# Patient Record
Sex: Male | Born: 2000 | Race: Black or African American | Hispanic: No | Marital: Single | Smoking: Never smoker
Health system: Southern US, Community
[De-identification: ages and names within clinical notes are randomized; demographics above are authoritative.]

---

## 2019-04-22 ENCOUNTER — Ambulatory Visit: Payer: Self-pay

## 2019-04-22 ENCOUNTER — Other Ambulatory Visit: Payer: Self-pay

## 2019-04-22 ENCOUNTER — Ambulatory Visit (INDEPENDENT_AMBULATORY_CARE_PROVIDER_SITE_OTHER): Payer: BC Managed Care – PPO | Admitting: Orthopedic Surgery

## 2019-04-22 ENCOUNTER — Encounter: Payer: Self-pay | Admitting: Orthopedic Surgery

## 2019-04-22 DIAGNOSIS — M25572 Pain in left ankle and joints of left foot: Secondary | ICD-10-CM | POA: Diagnosis not present

## 2019-04-22 NOTE — Progress Notes (Signed)
Office Visit Note   Patient: Rick Miles           Date of Birth: 10/29/2000           MRN: [id number] Visit Date: 04/22/2019 Requested by: No referring provider defined for this encounter. PCP: Ronnald Nian, MD  Subjective: Chief Complaint  Patient presents with  . Left Ankle - Pain  . 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  6 days ago he came down from a dunk and had some medial sided pain.  This forced him to come out of the practice session.  Reports continued pain since that time localizing to the medial side.  Denies any prior injury to the foot.  When he came down there was no inversion or eversion injury to the foot or ankle.              ROS: All systems reviewed are negative as they relate to the chief complaint within the history of present illness.  Patient denies  fevers or chills.   Assessment & Plan: Visit Diagnoses:  1. Pain in left ankle and joints of left foot     Plan: Impression is medial sided ankle pain in a high-level athlete with normal radiographic findings and mild swelling in the tibiotalar joint.  I think this could represent talar dome lesion versus stress reaction.  He has tried conservative measures such as a walking boot and anti-inflammatories.  Concern at this time would be for occult talar dome lesion versus posterior tib tendinitis versus stress reaction in the talus.  Plan MRI scan left ankle for evaluation for talar dome lesion and talar stress fracture.  Follow-up after that study.  Follow-Up Instructions: No follow-ups on file.   Orders:  Orders Placed This Encounter  Procedures  . XR Foot Complete Left  . XR Ankle Complete Left  . MR Ankle Left w/o contrast   No orders of the defined types were placed in this encounter.     Procedures: No procedures performed   Clinical Data: No additional findings.  Objective: Vital Signs: There were no vitals taken for this  visit.  Physical Exam:   Constitutional: Patient appears well-developed HEENT:  Head: Normocephalic Eyes:EOM are normal Neck: Normal range of motion Cardiovascular: Normal rate Pulmonary/chest: Effort normal Neurologic: Patient is alert Skin: Skin is warm Psychiatric: Patient has normal mood and affect    Ortho Exam: Ortho exam demonstrates antalgic gait to the left.  Patient has palpable intact nontender anterior to posterior to peroneal Achilles tendons.  Has pretty good inversion eversion strength but does localize tenderness around the medial malleolus but deeper.  No definite mechanical symptoms with passive range of motion of the tibiotalar or subtalar joint.  No pain with pronation supination of the forefoot.  No other masses lymphadenopathy or skin changes noted in that left foot region.  Slight swelling of the tibiotalar joint is present.  Specialty Comments:  No specialty comments available.  Imaging: XR Ankle Complete Left  Result Date: 04/22/2019 AP mortise lateral left ankle reviewed.   Tibiotalar joint maintained.  No asymmetry of the mortise..  No acute fracture.  Normal left ankle  XR Foot Complete Left  Result Date: 04/22/2019 AP lateral oblique left foot reviewed.  No acute fracture.  Tarsometatarsal alignment intact.  Talar neck intact without fracture.  Anterior process calcaneus normal.  Normal left foot    PMFS History: There are  no problems to display for this patient.  No past medical history on file.  No family history on file.   Social History   Occupational History  . Not on file  Tobacco Use  . Smoking status: Not on file  Substance and Sexual Activity  . Alcohol use: Not on file  . Drug use: Not on file  . Sexual activity: Not on file

## 2019-07-31 ENCOUNTER — Encounter (HOSPITAL_COMMUNITY): Payer: Self-pay | Admitting: Emergency Medicine

## 2019-07-31 ENCOUNTER — Emergency Department (HOSPITAL_COMMUNITY): Payer: BC Managed Care – PPO

## 2019-07-31 ENCOUNTER — Emergency Department (HOSPITAL_COMMUNITY)
Admission: EM | Admit: 2019-07-31 | Discharge: 2019-07-31 | Disposition: A | Discharge status: Home / Self Care | Payer: BC Managed Care – PPO | Attending: Emergency Medicine | Admitting: Emergency Medicine

## 2019-07-31 ENCOUNTER — Other Ambulatory Visit: Payer: Self-pay

## 2019-07-31 DIAGNOSIS — E876 Hypokalemia: Secondary | ICD-10-CM | POA: Diagnosis not present

## 2019-07-31 DIAGNOSIS — R42 Dizziness and giddiness: Secondary | ICD-10-CM | POA: Diagnosis not present

## 2019-07-31 DIAGNOSIS — R112 Nausea with vomiting, unspecified: Secondary | ICD-10-CM | POA: Insufficient documentation

## 2019-07-31 DIAGNOSIS — R0600 Dyspnea, unspecified: Secondary | ICD-10-CM | POA: Insufficient documentation

## 2019-07-31 DIAGNOSIS — R0989 Other specified symptoms and signs involving the circulatory and respiratory systems: Secondary | ICD-10-CM

## 2019-07-31 DIAGNOSIS — F458 Other somatoform disorders: Secondary | ICD-10-CM

## 2019-07-31 DIAGNOSIS — R17 Unspecified jaundice: Secondary | ICD-10-CM

## 2019-07-31 DIAGNOSIS — R064 Hyperventilation: Secondary | ICD-10-CM | POA: Insufficient documentation

## 2019-07-31 DIAGNOSIS — R0602 Shortness of breath: Secondary | ICD-10-CM | POA: Diagnosis present

## 2019-07-31 LAB — COMPREHENSIVE METABOLIC PANEL
ALT: 27 U/L (ref 0–44)
AST: 46 U/L — ABNORMAL HIGH (ref 15–41)
Albumin: 4.8 g/dL (ref 3.5–5.0)
Alkaline Phosphatase: 66 U/L (ref 38–126)
Anion gap: 14 (ref 5–15)
BUN: 20 mg/dL (ref 6–20)
CO2: 20 mmol/L — ABNORMAL LOW (ref 22–32)
Calcium: 9.5 mg/dL (ref 8.9–10.3)
Chloride: 103 mmol/L (ref 98–111)
Creatinine, Ser: 1.2 mg/dL (ref 0.61–1.24)
GFR calc Af Amer: >60 mL/min (ref >60)
GFR calc non Af Amer: >60 mL/min (ref >60)
Glucose, Bld: 112 mg/dL — ABNORMAL HIGH (ref 70–99)
Potassium: 3 mmol/L — ABNORMAL LOW (ref 3.5–5.1)
Sodium: 137 mmol/L (ref 135–145)
Total Bilirubin: 2.9 mg/dL — ABNORMAL HIGH (ref 0.3–1.2)
Total Protein: 7.7 g/dL (ref 6.5–8.1)

## 2019-07-31 LAB — CBC
HCT: 39.9 % (ref 39.0–52.0)
Hemoglobin: 13.8 g/dL (ref 13.0–17.0)
MCH: 32.1 pg (ref 26.0–34.0)
MCHC: 34.6 g/dL (ref 30.0–36.0)
MCV: 92.8 fL (ref 80.0–100.0)
Platelets: 165 10*3/uL (ref 150–400)
RBC: 4.3 MIL/uL (ref 4.22–5.81)
RDW: 12.8 % (ref 11.5–15.5)
WBC: 9.1 10*3/uL (ref 4.0–10.5)
nRBC: 0 % (ref 0.0–0.2)

## 2019-07-31 LAB — URINALYSIS, ROUTINE W REFLEX MICROSCOPIC
Bilirubin Urine: NEGATIVE
Glucose, UA: NEGATIVE mg/dL
Hgb urine dipstick: NEGATIVE
Ketones, ur: 80 mg/dL — AB
Leukocytes,Ua: NEGATIVE
Nitrite: NEGATIVE
Protein, ur: 30 mg/dL — AB
Specific Gravity, Urine: 1.023 (ref 1.005–1.030)
pH: 9 — ABNORMAL HIGH (ref 5.0–8.0)

## 2019-07-31 LAB — LIPASE, BLOOD: Lipase: 25 U/L (ref 11–51)

## 2019-07-31 MED ORDER — LORAZEPAM 2 MG/ML IJ SOLN
1.0000 mg | Freq: Once | INTRAMUSCULAR | Status: AC
  Administered 2019-07-31: 1 mg via INTRAVENOUS
  Filled 2019-07-31: qty 1

## 2019-07-31 MED ORDER — SODIUM CHLORIDE 0.9 % IV BOLUS
1000.0000 mL | Freq: Once | INTRAVENOUS | Status: AC
  Administered 2019-07-31: 1000 mL via INTRAVENOUS

## 2019-07-31 MED ORDER — ONDANSETRON 4 MG PO TBDP
4.0000 mg | ORAL_TABLET | Freq: Once | ORAL | Status: DC | PRN
  Filled 2019-07-31: qty 1

## 2019-07-31 MED ORDER — ONDANSETRON 4 MG PO TBDP
4.0000 mg | ORAL_TABLET | Freq: Once | ORAL | Status: AC | PRN
  Administered 2019-07-31: 4 mg via ORAL

## 2019-07-31 MED ORDER — POTASSIUM CHLORIDE CRYS ER 20 MEQ PO TBCR
40.0000 meq | EXTENDED_RELEASE_TABLET | Freq: Once | ORAL | Status: AC
  Administered 2019-07-31: 40 meq via ORAL
  Filled 2019-07-31: qty 2

## 2019-07-31 MED ORDER — SODIUM CHLORIDE 0.9% FLUSH: 3.0000 mL | Freq: Once | INTRAVENOUS | Status: DC

## 2019-07-31 MED ORDER — ONDANSETRON HCL 4 MG/2ML IJ SOLN
4.0000 mg | Freq: Once | INTRAMUSCULAR | Status: AC
  Administered 2019-07-31: 4 mg via INTRAVENOUS
  Filled 2019-07-31: qty 2

## 2019-07-31 MED ORDER — ONDANSETRON HCL 4 MG PO TABS
4.0000 mg | ORAL_TABLET | Freq: Four times a day (QID) | ORAL | 0 refills | Status: AC | PRN
Start: 2019-07-31 — End: ?

## 2019-07-31 NOTE — ED Triage Notes (Signed)
Patient is complaining of sob and body aches from his second covid shot. Patient all sprawled out acting like he is dying. Patient O2 sat is 100%.

## 2019-07-31 NOTE — Discharge Instructions (Addendum)
Return if you are having any problems. 

## 2019-07-31 NOTE — ED Provider Notes (Signed)
[facility]-EMERGENCY DEPT Provider Note   CSN: [id number] Arrival date & time: 07/31/19  0349   History Chief Complaint  Patient presents with   Emesis   Shortness of Breath    Rick Miles is a 19 y.o. male.  The history is provided by the patient.  Emesis Shortness of Breath Associated symptoms: vomiting   He has history of tobacco use and comes in because of vomiting and dyspnea.  He received his second dose of the Materna vaccine for COVID-19 yesterday, and was doing well until he woke up tonight with multiple episodes of vomiting.  He also noted he was dyspneic.  His hands were numb and his face was numb and his mouth was dry.  He noticed that his hands were in spasm.  There was associated dizziness almost to the point of near syncope.  He did not have any numbness or spasm in his feet.  There was no fever, and no chills or sweats.  History reviewed. No pertinent past medical history.  There are no problems to display for this patient.   History reviewed. No pertinent surgical history.     History reviewed. No pertinent family history.  Social History   Tobacco Use   Smoking status: Never Smoker   Smokeless tobacco: Never Used  Substance Use Topics   Alcohol use: Not on file   Drug use: Not on file    Home Medications Prior to Admission medications   Not on File    Allergies    Patient has no known allergies.  Review of Systems   Review of Systems  Respiratory: Positive for shortness of breath.   Gastrointestinal: Positive for vomiting.  All other systems reviewed and are negative.   Physical Exam Updated Vital Signs BP (!) 145/61    Pulse (!) 112    Temp 98.1 F (36.7 C)    Resp (!) 21    Ht 6\' 5"  (1.956 m)    Wt 93 kg    SpO2 100%    BMI 24.31 kg/m   Physical Exam Vitals and nursing note reviewed.   19 year old male, resting comfortably and in no acute distress. Vital signs are significant for elevated blood  pressure, respiratory rate, heart rate. Oxygen saturation is 100%, which is normal.  He appears mildly tachypneic at rest. Head is normocephalic and atraumatic. PERRLA, EOMI. Oropharynx is clear. Neck is nontender and supple without adenopathy or JVD. Back is nontender and there is no CVA tenderness. Lungs are clear without rales, wheezes, or rhonchi. Chest is nontender. Heart has regular rate and rhythm without murmur. Abdomen is soft, flat, nontender without masses or hepatosplenomegaly and peristalsis is normoactive.  Bruit is noted in the upper abdomen in the midline. Extremities have no cyanosis or edema, full range of motion is present. Skin is warm and dry without rash. Neurologic: Mental status is normal, cranial nerves are intact, there are no motor or sensory deficits.  ED Results / Procedures / Treatments   Labs (all labs ordered are listed, but only abnormal results are displayed) Labs Reviewed  COMPREHENSIVE METABOLIC PANEL - Abnormal; Notable for the following components:      Result Value   Potassium 3.0 (*)    CO2 20 (*)    Glucose, Bld 112 (*)    AST 46 (*)    Total Bilirubin 2.9 (*)    All other components within normal limits  URINALYSIS, ROUTINE W REFLEX MICROSCOPIC - Abnormal; Notable for the  following components:   pH 9.0 (*)    Ketones, ur 80 (*)    Protein, ur 30 (*)    Bacteria, UA RARE (*)    All other components within normal limits  LIPASE, BLOOD  CBC    EKG EKG Interpretation  Date/Time:  Friday July 31 2019 04:00:44 EDT Ventricular Rate:  111 PR Interval:    QRS Duration: 99 QT Interval:  304 QTC Calculation: 413 R Axis:   87 Text Interpretation: Sinus tachycardia Probable left ventricular hypertrophy Anterior ST elevation, probably due to LVH No old tracing to compare Confirmed by Delora Fuel ([id number]) on 07/31/2019 4:25:03 AM   Radiology DG Chest 2 View  Result Date: 07/31/2019 CLINICAL DATA:  19 year old male with fever shortness of  breath and body aches following 2nd COVID-19 vaccination. EXAM: CHEST - 2 VIEW COMPARISON:  None. FINDINGS: Cardiac size at the upper limits of normal. Other mediastinal contours are within normal limits. Visualized tracheal air column is within normal limits. Relatively normal lung volumes. Allowing for portable technique the lungs are clear. No pleural effusion. Negative visible bowel gas and osseous structures. IMPRESSION: Negative portable chest. Electronically Signed   By: Genevie Ann M.D.   On: 07/31/2019 05:47   US Aorta  Result Date: 07/31/2019 CLINICAL DATA:  Abdominal bruit. EXAM: ULTRASOUND OF ABDOMINAL AORTA TECHNIQUE: Ultrasound examination of the abdominal aorta and proximal common iliac arteries was performed to evaluate for aneurysm. Additional color and Doppler images of the distal aorta were obtained to document patency. COMPARISON:  Chest x-ray 07/31/2019. FINDINGS: Abdominal aortic measurements as follows: Proximal:  2.2 cm Mid:  2.2 cm Distal:  2.3 cm Right common iliac artery: 1.6 cm Left common iliac artery: 1.6 cm Aortic and iliac arteries are widely patent. IMPRESSION: Mild prominence of both common iliac arteries. No evidence of abdominal aortic aneurysm. Aorta and iliac arteries are widely patent. Electronically Signed   By: Marcello Moores  Register   On: 07/31/2019 07:37    Procedures Procedures  Medications Ordered in ED Medications  sodium chloride flush (NS) 0.9 % injection 3 mL (3 mLs Intravenous Not Given 07/31/19 0527)  ondansetron (ZOFRAN-ODT) disintegrating tablet 4 mg (has no administration in time range)  sodium chloride 0.9 % bolus 1,000 mL (has no administration in time range)  LORazepam (ATIVAN) injection 1 mg (has no administration in time range)  potassium chloride SA (KLOR-CON) CR tablet 40 mEq (has no administration in time range)  ondansetron (ZOFRAN) injection 4 mg (has no administration in time range)  ondansetron (ZOFRAN-ODT) disintegrating tablet 4 mg (4 mg Oral  Given 07/31/19 0502)    ED Course  I have reviewed the triage vital signs and the nursing notes.  Pertinent labs & imaging results that were available during my care of the patient were reviewed by me and considered in my medical decision making (see chart for details).  MDM Rules/Calculators/A&P Nausea and vomiting which appears to have resolved.  Dyspnea which is also improving.  Dizziness, facial numbness, hand numbness, hand spasm sounds like hyperventilation syndrome.  Chest x-ray shows no acute process.  ECG shows no acute process.  Labs are significant for minimal elevation of AST which is not felt to be clinically significant.  Also, hypokalemia and elevated total bilirubin.  Hypokalemia is felt to be secondary to vomiting and he is given oral potassium.  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  He will be given IV fluids, ondansetron, K. Dur.  Given abdominal bruit, will get  ultrasound of abdominal aorta.  Ultrasound shows no evidence of aneurysm.  He feels much better after above-noted treatment.  He is discharged with prescription for for ondansetron.  Final Clinical Impression(s) / ED Diagnoses Final diagnoses:  Non-intractable vomiting with nausea, unspecified vomiting type  Hypokalemia  Abdominal bruit  Hyperventilation syndrome  Serum total bilirubin elevated    Rx / DC Orders ED Discharge Orders         Ordered    ondansetron (ZOFRAN) 4 MG tablet  Every 6 hours PRN     Discontinue  Reprint     07/31/19 0750           Dione Booze, MD 07/31/19 [phone]

## 2021-06-30 IMAGING — CR DG CHEST 2V
2 series · 2 of 2 positions shown · non-contrast
Comparison: None.

CLINICAL DATA: 18-year-old male with fever shortness of breath and
body aches following 2nd [id number] vaccination.

EXAM:
CHEST - 2 VIEW

[w chest pa]
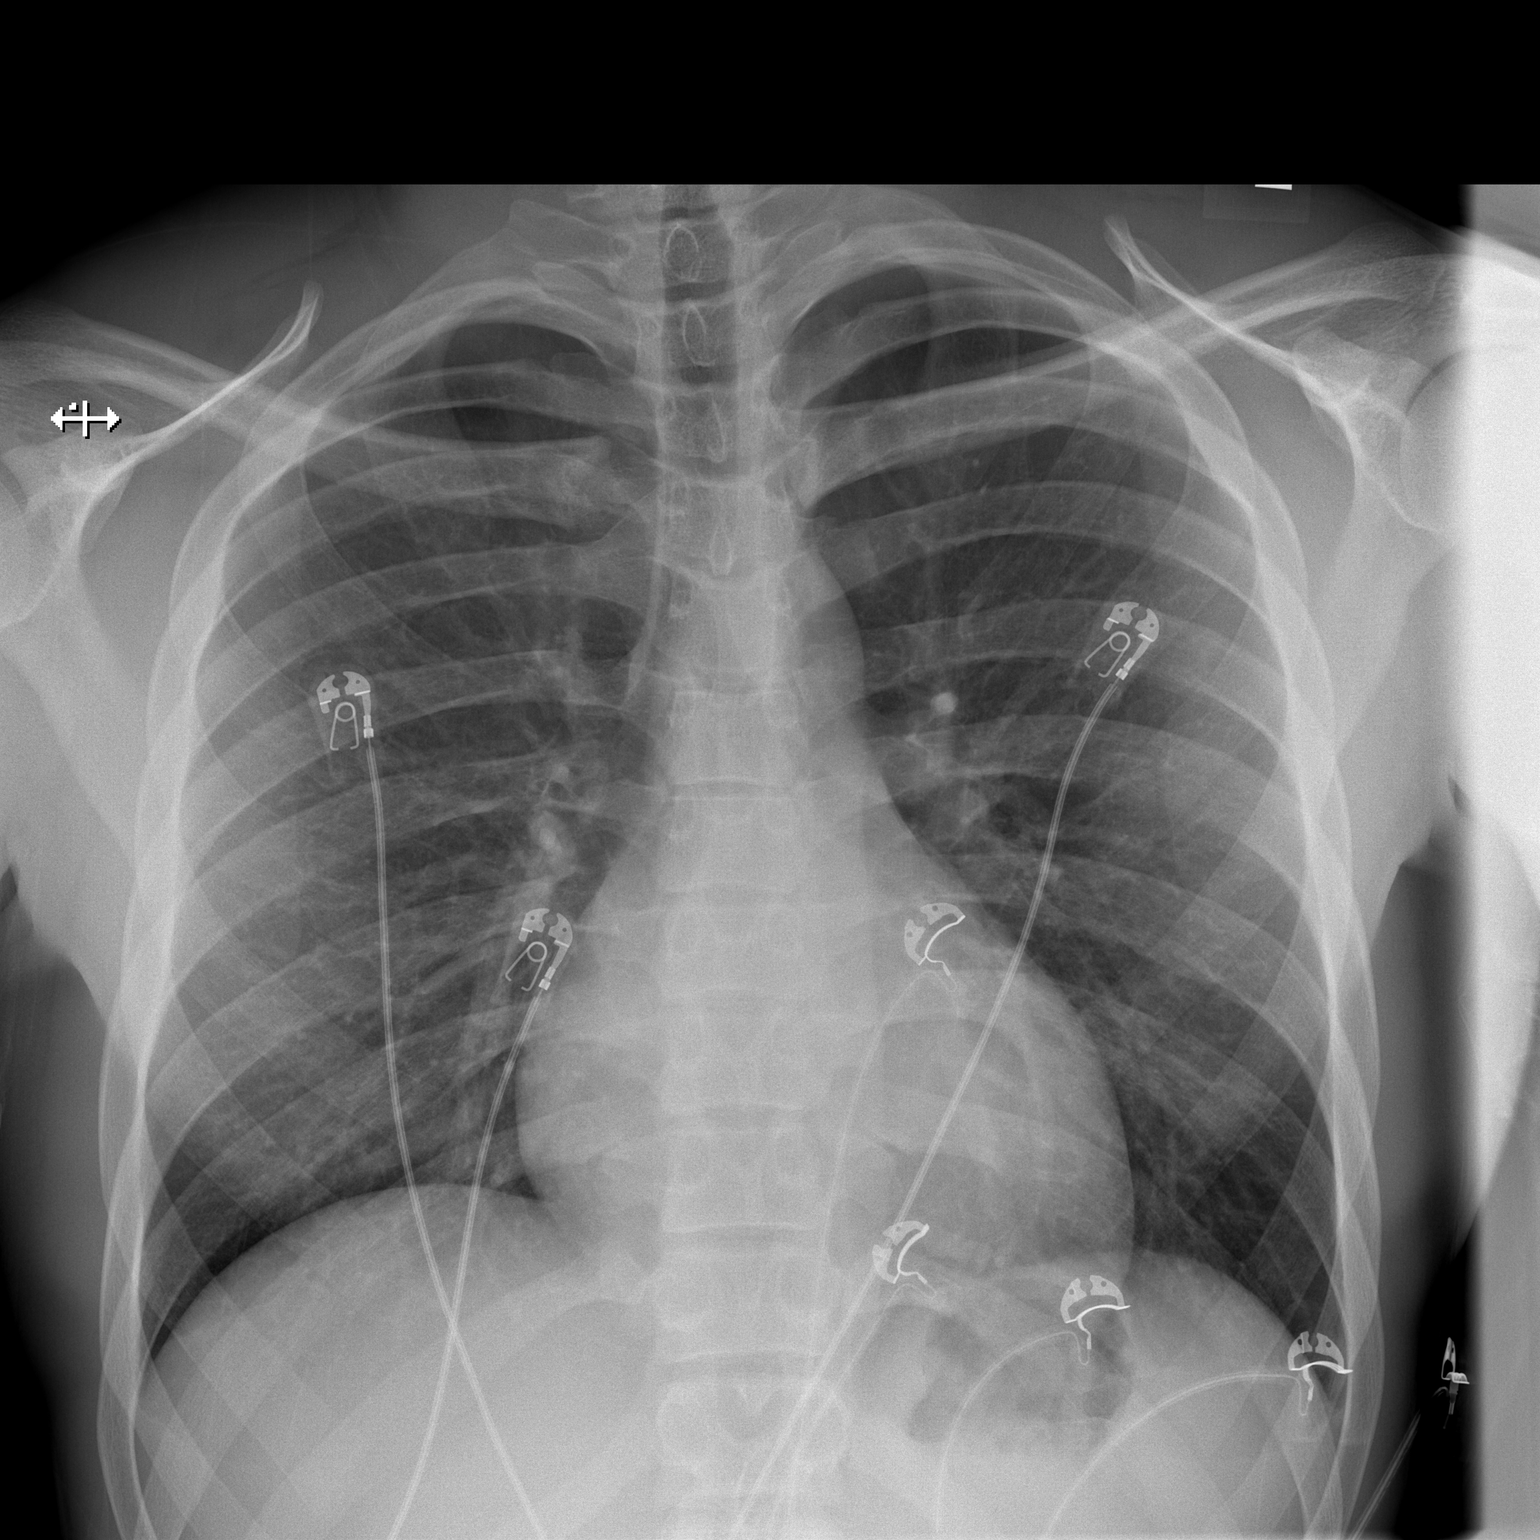

[w chest lat]
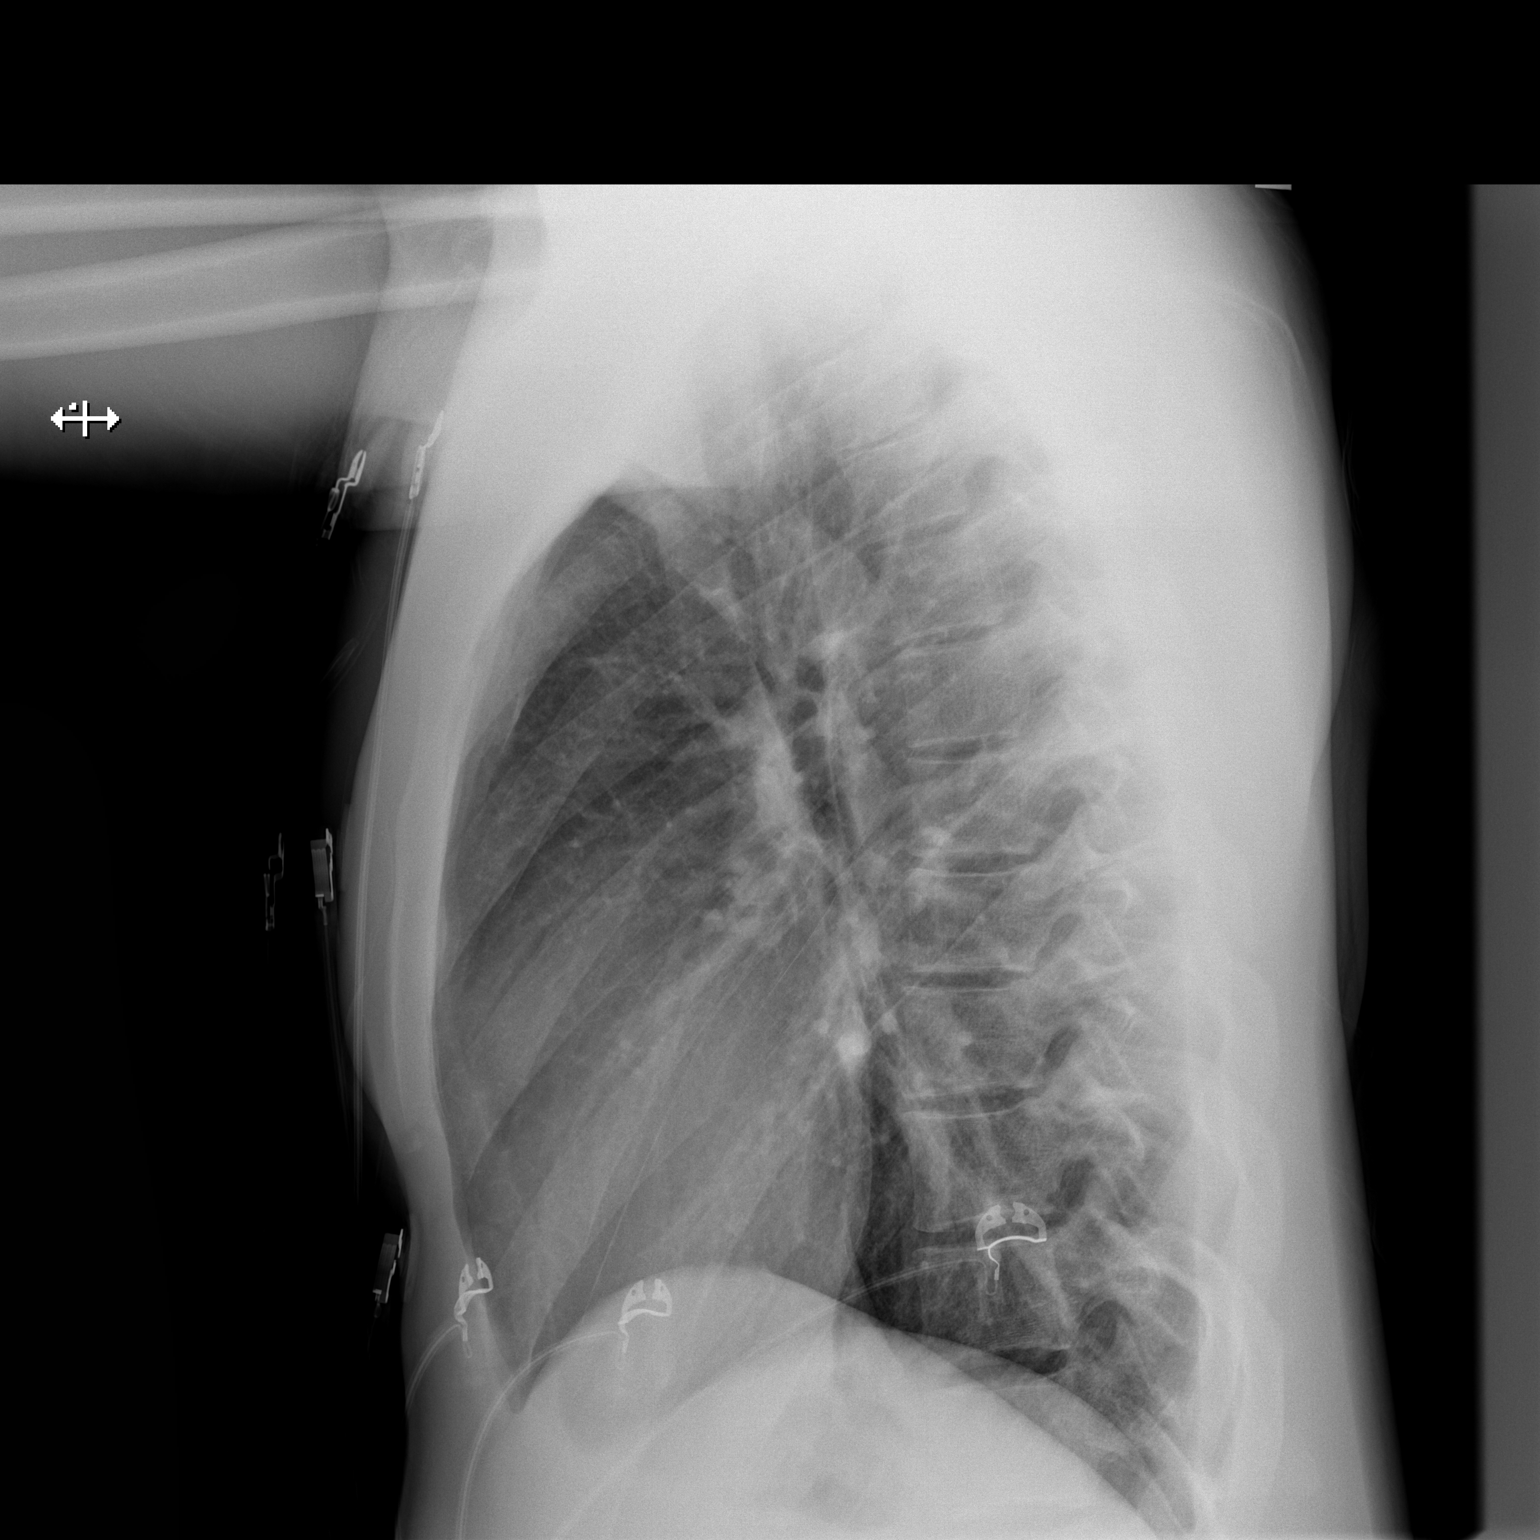

[2 of 2 positions shown; findings below may reference images not displayed]

FINDINGS: Cardiac size at the upper limits of normal. Other mediastinal
contours are within normal limits. Visualized tracheal air column is
within normal limits. Relatively normal lung volumes. Allowing for
portable technique the lungs are clear. No pleural effusion.
Negative visible bowel gas and osseous structures.
IMPRESSION: Negative portable chest.

## 2021-06-30 IMAGING — US US AORTA
1 series · 14 of 15 positions shown · non-contrast
Comparison: Chest x-ray 07/31/2019.

CLINICAL DATA: Abdominal bruit.

EXAM:
ULTRASOUND OF ABDOMINAL AORTA
TECHNIQUE: Ultrasound examination of the abdominal aorta and proximal common
iliac arteries was performed to evaluate for aneurysm. Additional
color and Doppler images of the distal aorta were obtained to
document patency.

[Series 1: us aorta · 14 of 15 slices shown]
[im 1/15]
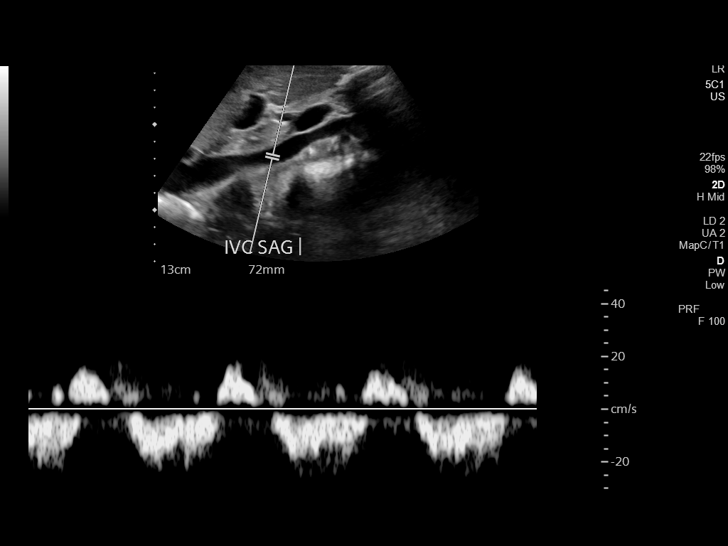
[im 2/15]
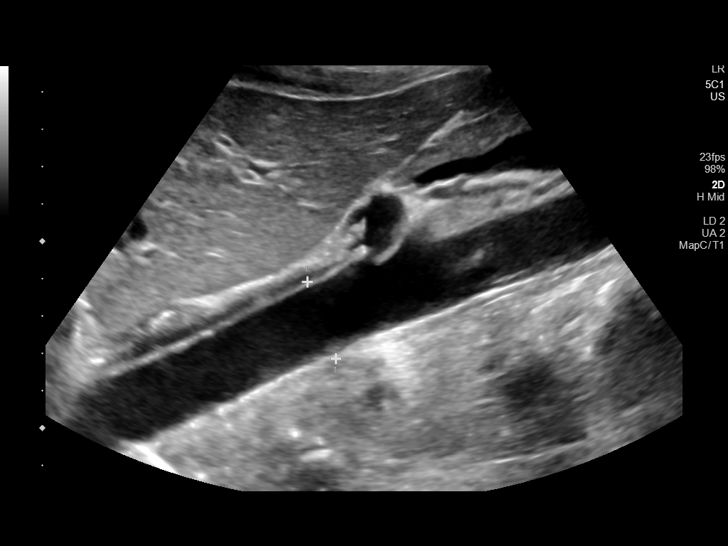
[im 3/15]
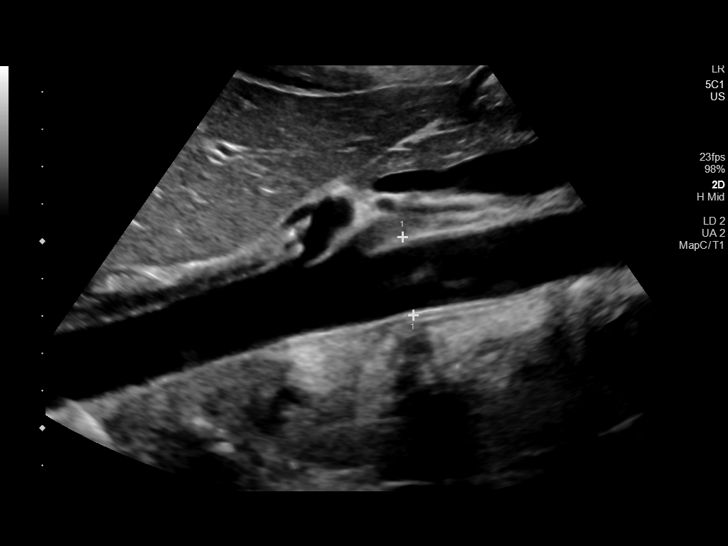
[im 4/15]
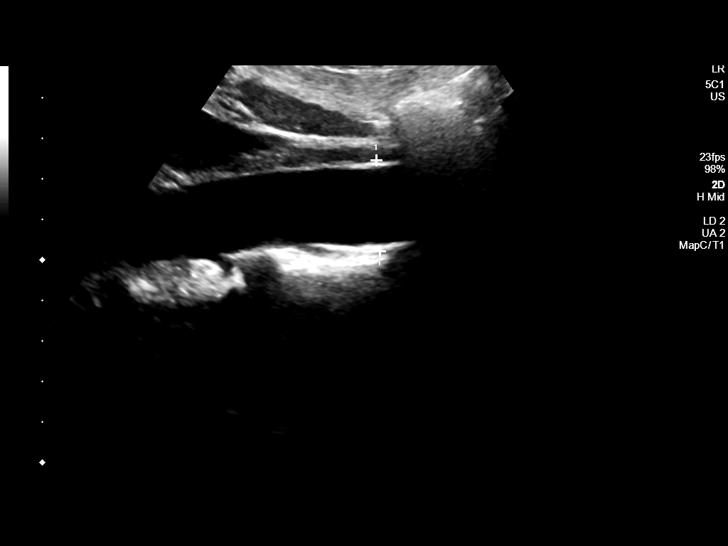
[im 5/15]
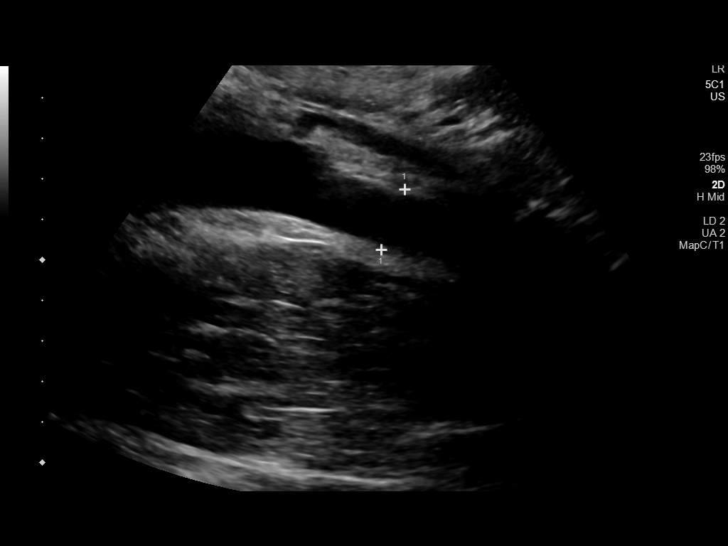
[im 6/15]
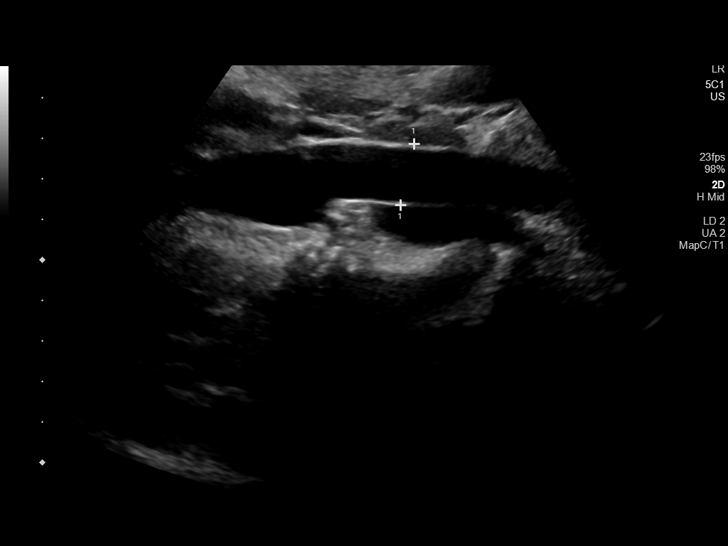
[im 7/15]
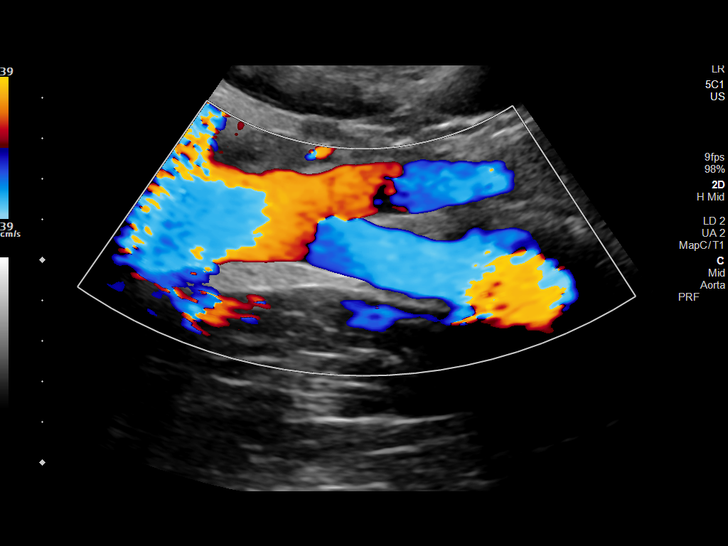
[im 9/15]
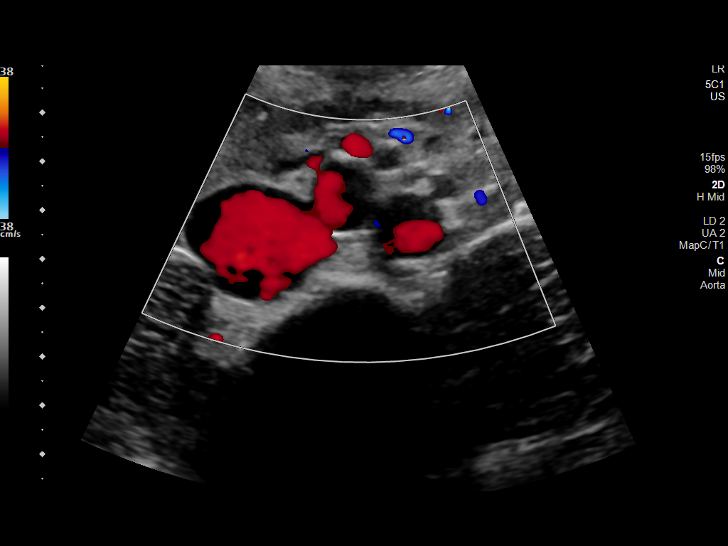
[im 10/15]
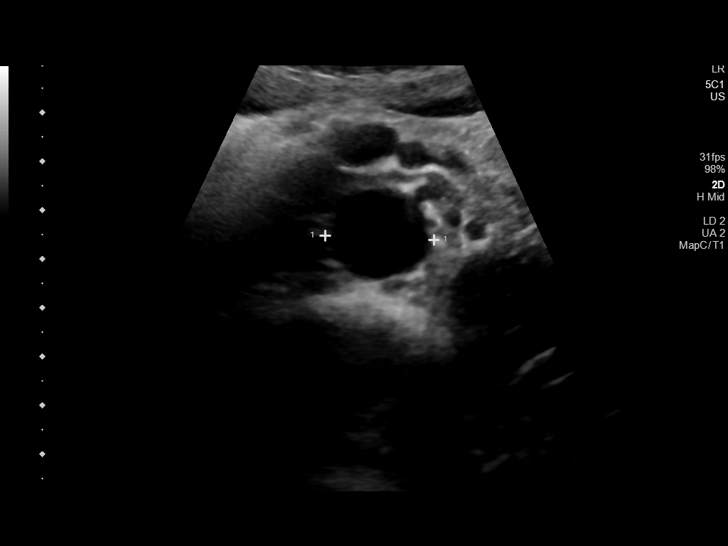
[im 11/15]
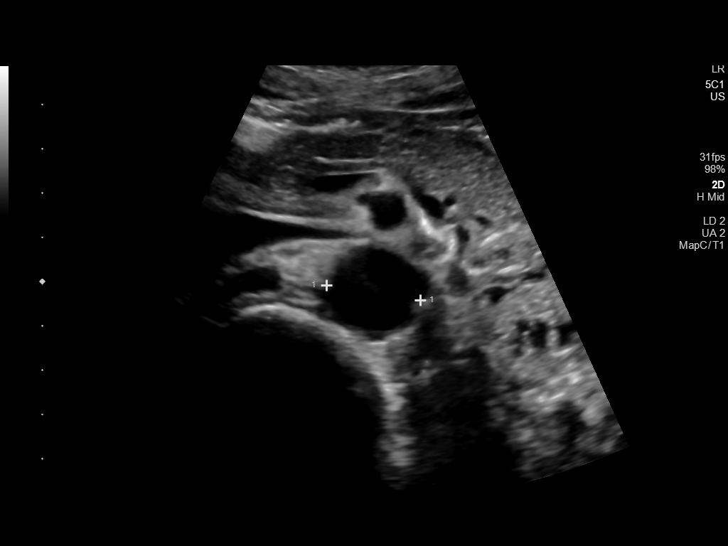
[im 12/15]
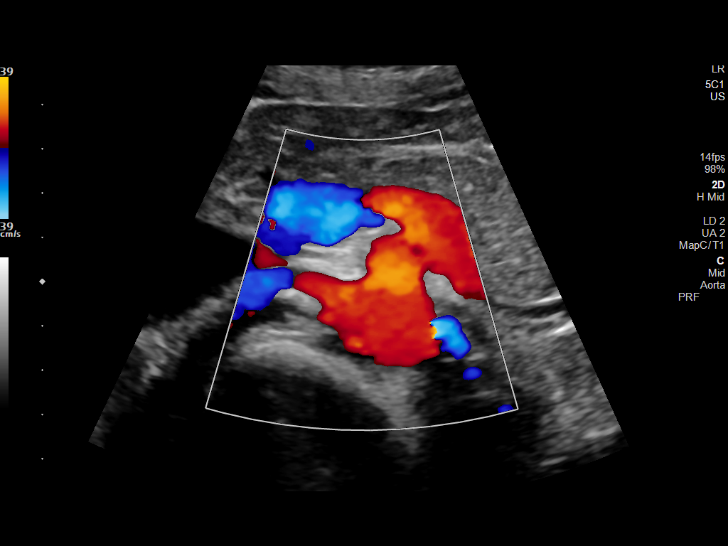
[im 13/15]
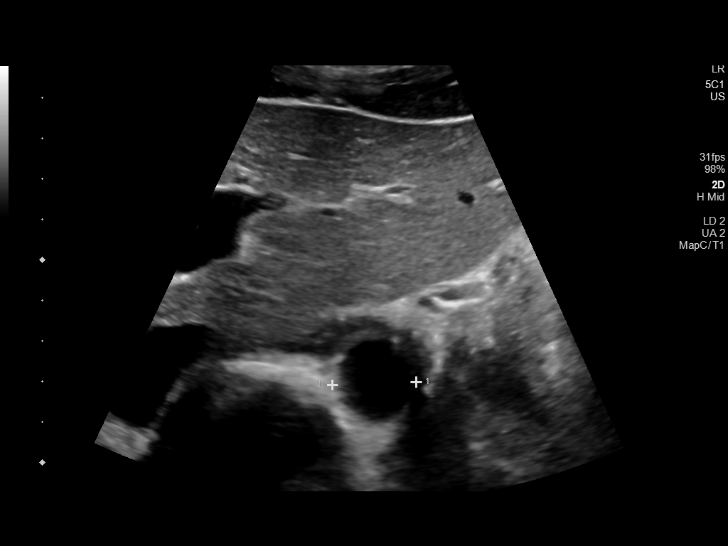
[im 14/15]
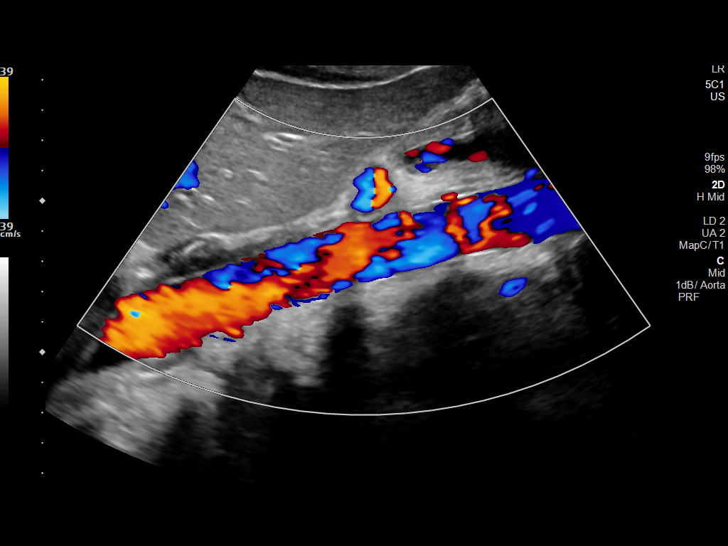
[im 15/15]
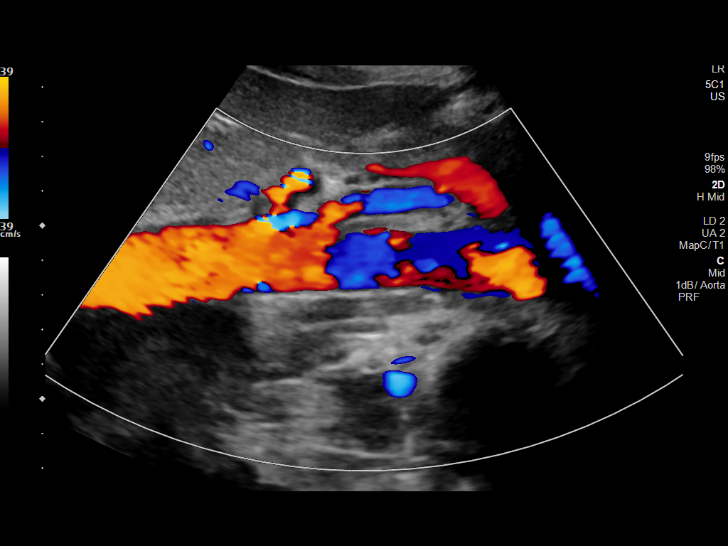

[14 of 15 positions shown; findings below may reference images not displayed]

FINDINGS: Abdominal aortic measurements as follows:

Proximal:  2.2 cm

Mid:  2.2 cm

Distal:  2.3 cm

Right common iliac artery: 1.6 cm

Left common iliac artery: 1.6 cm

Aortic and iliac arteries are widely patent.
IMPRESSION: Mild prominence of both common iliac arteries. No evidence of
abdominal aortic aneurysm. Aorta and iliac arteries are widely
patent.
